# Patient Record
Sex: Female | Born: 2005 | Race: Black or African American | Hispanic: No | Marital: Single | Smoking: Never smoker
Health system: Southern US, Community
[De-identification: ages and names within clinical notes are randomized; demographics above are authoritative.]

---
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
## 2024-03-21 ENCOUNTER — Ambulatory Visit (INDEPENDENT_AMBULATORY_CARE_PROVIDER_SITE_OTHER): Payer: MEDICAID | Admitting: Podiatry

## 2024-03-21 DIAGNOSIS — Z91198 Patient's noncompliance with other medical treatment and regimen for other reason: Secondary | ICD-10-CM

## 2024-03-21 NOTE — Progress Notes (Signed)
 1. Failure to attend appointment with reason given    Canceled and rescheduled by patient.

## 2024-03-24 ENCOUNTER — Ambulatory Visit (INDEPENDENT_AMBULATORY_CARE_PROVIDER_SITE_OTHER): Payer: MEDICAID | Admitting: Podiatry

## 2024-03-24 DIAGNOSIS — Z91198 Patient's noncompliance with other medical treatment and regimen for other reason: Secondary | ICD-10-CM

## 2024-03-24 NOTE — Progress Notes (Signed)
 1. Failure to attend appointment with reason given    Patient called and rescheduled appointment.

## 2024-04-04 ENCOUNTER — Encounter: Payer: Self-pay | Admitting: Podiatry

## 2024-04-04 ENCOUNTER — Ambulatory Visit (INDEPENDENT_AMBULATORY_CARE_PROVIDER_SITE_OTHER): Payer: MEDICAID | Admitting: Podiatry

## 2024-04-04 DIAGNOSIS — M79675 Pain in left toe(s): Secondary | ICD-10-CM | POA: Diagnosis not present

## 2024-04-04 DIAGNOSIS — M79674 Pain in right toe(s): Secondary | ICD-10-CM | POA: Diagnosis not present

## 2024-04-04 DIAGNOSIS — B351 Tinea unguium: Secondary | ICD-10-CM

## 2024-04-08 NOTE — Progress Notes (Signed)
  Subjective:  Patient ID: Veronica Ayers, female    DOB: 13-Jul-2006,  MRN: [id number]  Veronica Ayers presents to clinic today for painful thick toenails that are difficult to trim. Pain interferes with ambulation. Aggravating factors include wearing enclosed shoe gear. Pain is relieved with periodic professional debridement. She is accompanied by her mom, Veronica Ayers, on today's visit. Chief Complaint  Patient presents with   Nail Problem    Pt is here for RFC    New problem(s): None.   PCP is Shelda Atlas, MD.  No Known Allergies  Review of Systems: Negative except as noted in the HPI.  Objective: No changes noted in today's physical examination. There were no vitals filed for this visit. Veronica Ayers is a pleasant 18 y.o. female in NAD. AAO x 3.  Vascular Examination: Capillary refill time immediate b/l. Palpable pedal pulses. Pedal hair present b/l. No pain with calf compression b/l. Skin temperature gradient WNL b/l. No cyanosis or clubbing b/l. No ischemia or gangrene noted b/l.   Neurological Examination: Sensation grossly intact b/l with 10 gram monofilament. Vibratory sensation intact b/l.   Dermatological Examination: Pedal skin with normal turgor, texture and tone b/l.  No open wounds. No interdigital macerations.   Toenails 1-5 b/l thick, discolored, elongated with subungual debris and pain on dorsal palpation.   No hyperkeratotic nor porokeratotic lesions present on today's visit.  Musculoskeletal Examination: Patient ambulates independent of any assistive aids.  Radiographs: None  Assessment/Plan: 1. Pain due to onychomycosis of toenails of both feet     -Patient's family member present. Ayers questions/concerns addressed on today's visit. -Patient to continue soft, supportive shoe gear daily. -Mycotic toenails 1-5 bilaterally were debrided in length and girth with sterile nail nippers and dremel without incident. -Patient/POA to call should there be  question/concern in the interim.   Return in about 3 months (around 07/05/2024).  Delon LITTIE Merlin, DPM      [place] LOCATION: [address] [place] [address]                   Office [phone]   [facility] LOCATION: [address] [place], [place] [address] Office [phone]

## 2024-07-07 ENCOUNTER — Ambulatory Visit (INDEPENDENT_AMBULATORY_CARE_PROVIDER_SITE_OTHER): Payer: MEDICAID | Admitting: Podiatry

## 2024-07-07 DIAGNOSIS — Z91199 Patient's noncompliance with other medical treatment and regimen due to unspecified reason: Secondary | ICD-10-CM

## 2024-07-07 NOTE — Progress Notes (Signed)
 1. No-show for appointment
